# Patient Record
Sex: Female | Born: 1980 | Race: White | Hispanic: No | Marital: Married | Smoking: Never smoker
Health system: Southern US, Community
[De-identification: ages and names within clinical notes are randomized; demographics above are authoritative.]

## PROBLEM LIST (undated history)

## (undated) DIAGNOSIS — K219 Gastro-esophageal reflux disease without esophagitis: Secondary | ICD-10-CM

## (undated) HISTORY — PX: ESOPHAGOGASTRODUODENOSCOPY: SHX1529

---

## 2020-09-12 ENCOUNTER — Encounter (INDEPENDENT_AMBULATORY_CARE_PROVIDER_SITE_OTHER): Payer: Self-pay | Admitting: *Deleted

## 2020-12-06 ENCOUNTER — Emergency Department (HOSPITAL_COMMUNITY)
Admission: EM | Admit: 2020-12-06 | Discharge: 2020-12-06 | Disposition: A | Discharge status: Home / Self Care | Payer: Medicaid Other | Attending: Emergency Medicine | Admitting: Emergency Medicine

## 2020-12-06 ENCOUNTER — Emergency Department (HOSPITAL_COMMUNITY): Payer: Medicaid Other

## 2020-12-06 ENCOUNTER — Other Ambulatory Visit: Payer: Self-pay

## 2020-12-06 ENCOUNTER — Encounter (HOSPITAL_COMMUNITY): Payer: Self-pay | Admitting: Emergency Medicine

## 2020-12-06 DIAGNOSIS — R002 Palpitations: Secondary | ICD-10-CM | POA: Diagnosis present

## 2020-12-06 HISTORY — DX: Gastro-esophageal reflux disease without esophagitis: K21.9

## 2020-12-06 LAB — BASIC METABOLIC PANEL
Anion gap: 5 (ref 5–15)
BUN: 16 mg/dL (ref 6–20)
CO2: 26 mmol/L (ref 22–32)
Calcium: 8.5 mg/dL — ABNORMAL LOW (ref 8.9–10.3)
Chloride: 106 mmol/L (ref 98–111)
Creatinine, Ser: 0.78 mg/dL (ref 0.44–1.00)
GFR, Estimated: >60 mL/min (ref >60)
Glucose, Bld: 126 mg/dL — ABNORMAL HIGH (ref 70–99)
Potassium: 3.8 mmol/L (ref 3.5–5.1)
Sodium: 137 mmol/L (ref 135–145)

## 2020-12-06 LAB — CBC
HCT: 38.4 % (ref 36.0–46.0)
Hemoglobin: 12.6 g/dL (ref 12.0–15.0)
MCH: 30.8 pg (ref 26.0–34.0)
MCHC: 32.8 g/dL (ref 30.0–36.0)
MCV: 93.9 fL (ref 80.0–100.0)
Platelets: 149 10*3/uL — ABNORMAL LOW (ref 150–400)
RBC: 4.09 MIL/uL (ref 3.87–5.11)
RDW: 12.4 % (ref 11.5–15.5)
WBC: 6.5 10*3/uL (ref 4.0–10.5)
nRBC: 0 % (ref 0.0–0.2)

## 2020-12-06 LAB — TROPONIN I (HIGH SENSITIVITY): Troponin I (High Sensitivity): <2 ng/L (ref <18)

## 2020-12-06 NOTE — ED Triage Notes (Signed)
Pt c/o palpitations x 2 days 

## 2020-12-06 NOTE — ED Provider Notes (Signed)
[facility] EMERGENCY DEPARTMENT Provider Note   CSN: [id number] Arrival date & time: 12/06/20  3244     History Chief Complaint  Patient presents with   Palpitations    Kristin Fox is a 40 y.o. female.  The history is provided by the patient.  Palpitations Palpitations quality:  Irregular Onset quality:  Sudden Duration:  2 days Timing:  Intermittent Progression:  Improving Chronicity:  New Relieved by:  None tried Worsened by:  Nothing Associated symptoms: no chest pain, no cough, no dizziness, no lower extremity edema, no shortness of breath, no syncope, no vomiting and no weakness   Risk factors: no heart disease, no hx of atrial fibrillation, no hx of DVT and no hx of PE   Patient presents with palpitations for the past 2 days.  She reports it is intermittent.  She reports that it feels irregular, but not fast. No previous history of cardiac dysrhythmia.  No syncope.  Denies excessive caffeine use.  Denies drug abuse She does not take oral contraceptives    Past Medical History:  Diagnosis Date   Acid reflux     There are no problems to display for this patient.   Past Surgical History:  Procedure Laterality Date   ESOPHAGOGASTRODUODENOSCOPY       OB History   No obstetric history on file.     No family history on file.  Social History   Tobacco Use   Smoking status: Never   Smokeless tobacco: Never  Vaping Use   Vaping Use: Never used  Substance Use Topics   Alcohol use: Never   Drug use: Never    Home Medications Prior to Admission medications   Not on File    Allergies    Penicillins  Review of Systems   Review of Systems  Constitutional:  Negative for fever.  Respiratory:  Negative for cough and shortness of breath.   Cardiovascular:  Positive for palpitations. Negative for chest pain, leg swelling and syncope.  Gastrointestinal:  Negative for vomiting.  Neurological:  Negative for dizziness, syncope and weakness.  All  other systems reviewed and are negative.  Physical Exam Updated Vital Signs BP 100/62   Pulse (!) 58   Temp 98.3 F (36.8 C) (Oral)   Resp 17   Ht 1.727 m (5\' 8" )   Wt 90.7 kg   LMP 12/04/2020 (Approximate) Comment: per pt spotted around 12/04/20  SpO2 100%   BMI 30.41 kg/m   Physical Exam CONSTITUTIONAL: Well developed/well nourished HEAD: Normocephalic/atraumatic EYES: EOMI/PERRL ENMT: Mucous membranes moist NECK: supple no meningeal signs SPINE/BACK:entire spine nontender CV: S1/S2 noted, no murmurs/rubs/gallops noted LUNGS: Lungs are clear to auscultation bilaterally, no apparent distress ABDOMEN: soft, nontender, no rebound or guarding, bowel sounds noted throughout abdomen GU:no cva tenderness NEURO: Pt is awake/alert/appropriate, moves all extremitiesx4.  No facial droop.   EXTREMITIES: pulses normal/equal, full ROM, no calf tenderness or edema SKIN: warm, color normal PSYCH: no abnormalities of mood noted, alert and oriented to situation  ED Results / Procedures / Treatments   Labs (all labs ordered are listed, but only abnormal results are displayed) Labs Reviewed  BASIC METABOLIC PANEL - Abnormal; Notable for the following components:      Result Value   Glucose, Bld 126 (*)    Calcium 8.5 (*)    All other components within normal limits  CBC - Abnormal; Notable for the following components:   Platelets 149 (*)    All other components within normal limits  POC URINE PREG, ED  TROPONIN I (HIGH SENSITIVITY)    EKG EKG Interpretation  Date/Time:  Tuesday December 06 2020 02:38:14 EDT Ventricular Rate:  69 PR Interval:  183 QRS Duration: 88 QT Interval:  427 QTC Calculation: 458 R Axis:   64 Text Interpretation: Sinus rhythm Confirmed by Zadie Rhine [phone]) on 12/06/2020 4:12:47 AM  Radiology DG Chest 2 View  Result Date: 12/06/2020 CLINICAL DATA:  Chest pain and cardiac palpitations EXAM: CHEST - 2 VIEW COMPARISON:  09/08/2020 FINDINGS: The heart  size and mediastinal contours are within normal limits. Both lungs are clear. The visualized skeletal structures are unremarkable. IMPRESSION: No active cardiopulmonary disease. Electronically Signed   By: Alcide Clever M.D.   On: 12/06/2020 03:13    Procedures Procedures   Medications Ordered in ED Medications - No data to display  ED Course  I have reviewed the triage vital signs and the nursing notes.  Pertinent labs & imaging results that were available during my care of the patient were reviewed by me and considered in my medical decision making (see chart for details).    MDM Rules/Calculators/A&P                          Patient presents with palpitations.  EKG unremarkable.  Labs reassuring. While in the room, I did note 1 PVC on the monitor.  No other dysrhythmias noted on telemetry Patient is appropriate for outpatient management. Will refer to PCP and she may need Holter monitoring. Final Clinical Impression(s) / ED Diagnoses Final diagnoses:  Palpitations    Rx / DC Orders ED Discharge Orders     None        Zadie Rhine, MD 12/06/20 [phone]

## 2021-01-05 ENCOUNTER — Ambulatory Visit (INDEPENDENT_AMBULATORY_CARE_PROVIDER_SITE_OTHER): Payer: PRIVATE HEALTH INSURANCE | Admitting: Gastroenterology

## 2022-06-13 IMAGING — DX DG CHEST 2V
2 series · 2 of 2 positions shown · non-contrast
Comparison: 09/08/2020

CLINICAL DATA: Chest pain and cardiac palpitations

EXAM:
CHEST - 2 VIEW

[chest pa]
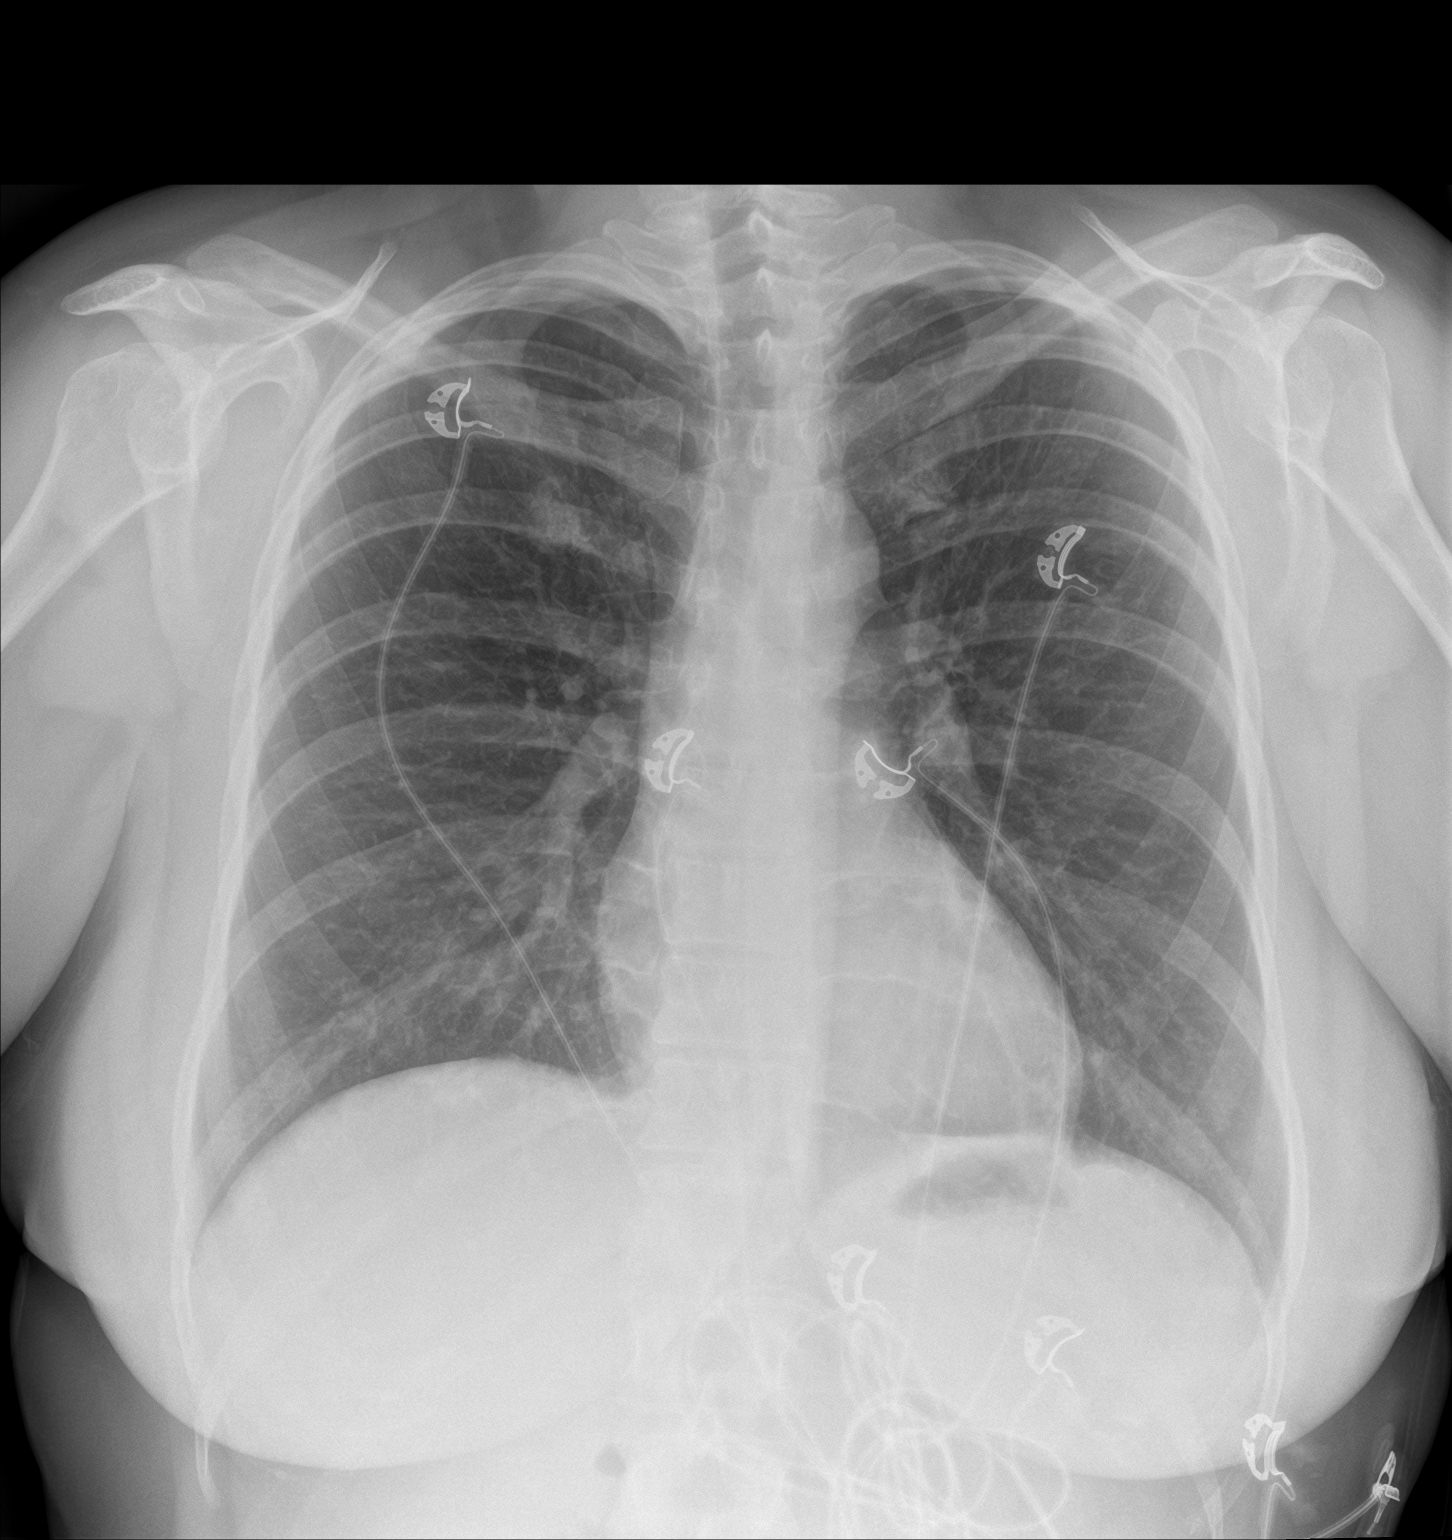

[chest lat]
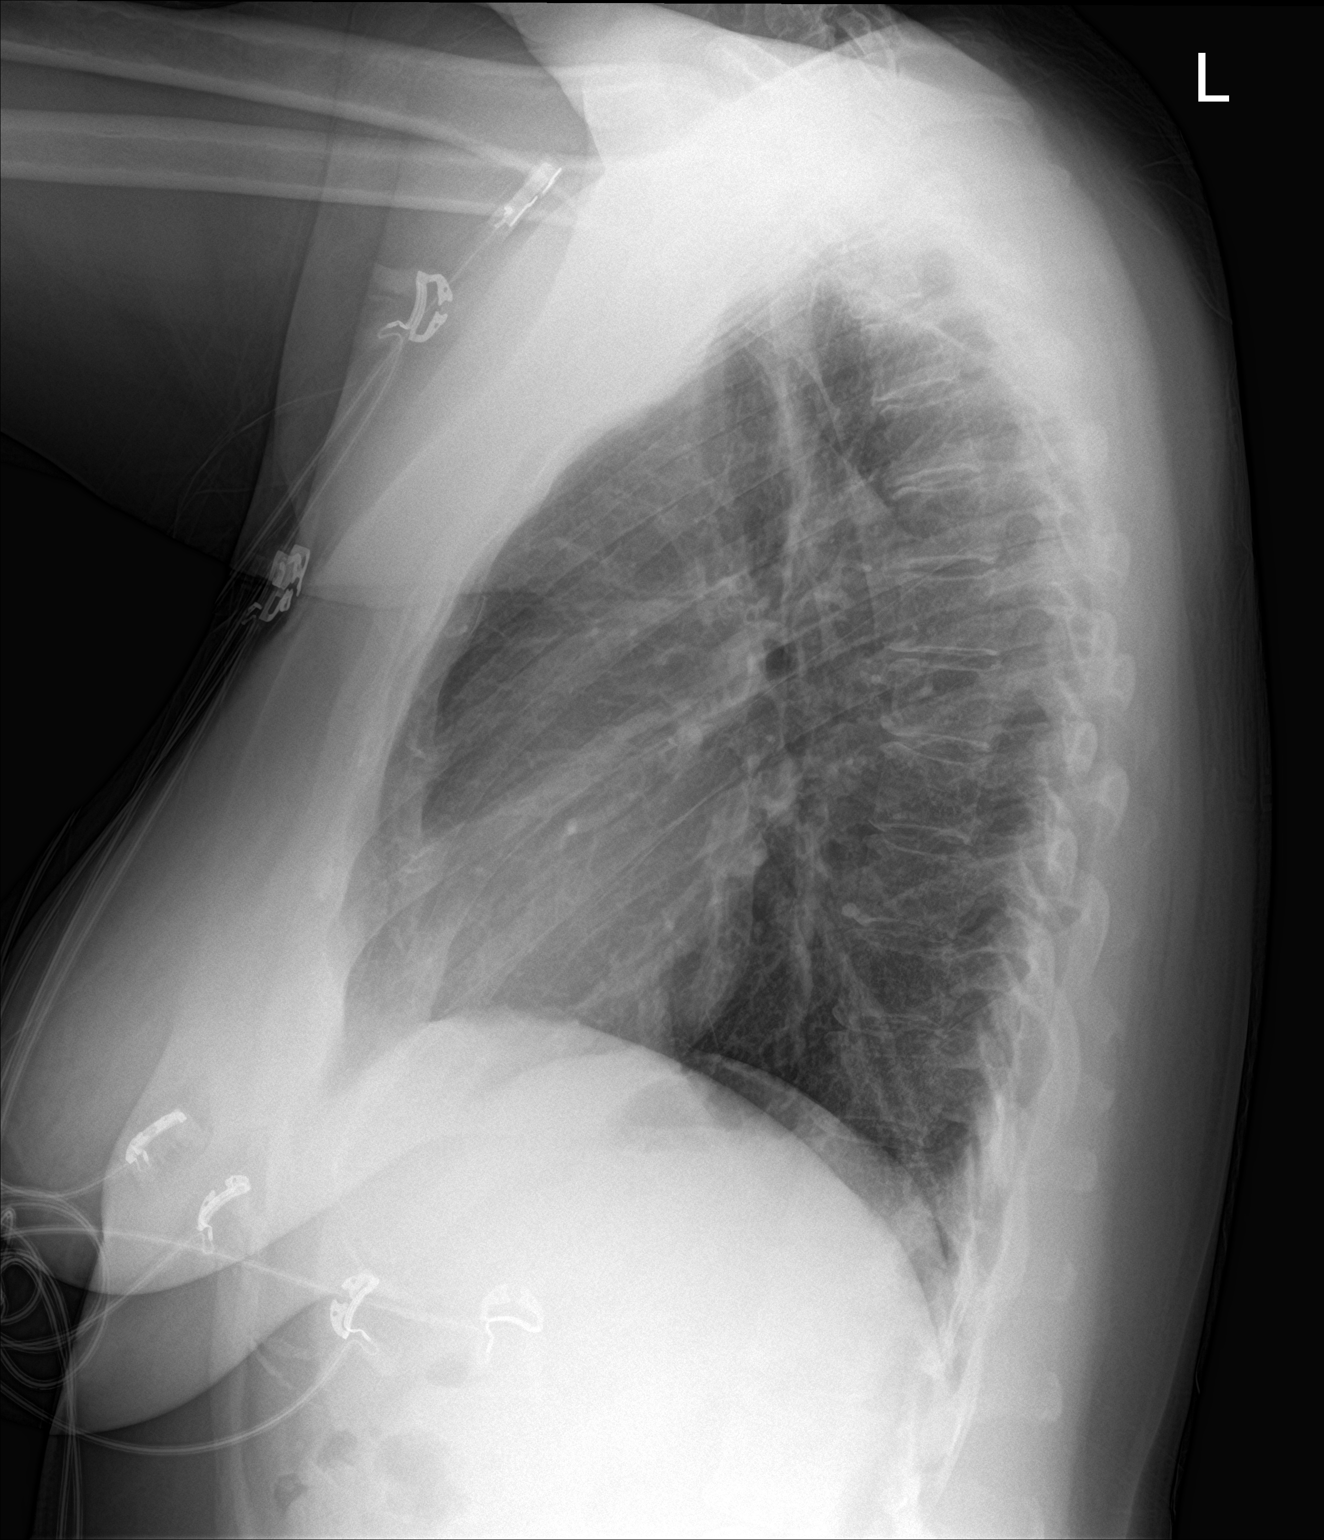

[2 of 2 positions shown; findings below may reference images not displayed]

FINDINGS: The heart size and mediastinal contours are within normal limits.
Both lungs are clear. The visualized skeletal structures are
unremarkable.
IMPRESSION: No active cardiopulmonary disease.

## 2023-03-11 ENCOUNTER — Ambulatory Visit: Payer: PRIVATE HEALTH INSURANCE | Admitting: Family Medicine
